# Patient Record
Sex: Female | Born: 1960 | Race: White | Hispanic: No | Marital: Married | Smoking: Current every day smoker
Health system: Southern US, Community
[De-identification: ages and names within clinical notes are randomized; demographics above are authoritative.]

## PROBLEM LIST (undated history)

## (undated) ENCOUNTER — Ambulatory Visit: Payer: Self-pay

---

## 2007-07-04 ENCOUNTER — Ambulatory Visit: Payer: Self-pay

## 2021-11-10 ENCOUNTER — Ambulatory Visit
Admission: EM | Admit: 2021-11-10 | Discharge: 2021-11-10 | Disposition: A | Discharge status: Home / Self Care | Payer: Managed Care, Other (non HMO) | Attending: Emergency Medicine | Admitting: Emergency Medicine

## 2021-11-10 ENCOUNTER — Ambulatory Visit
Admission: RE | Admit: 2021-11-10 | Discharge: 2021-11-10 | Disposition: A | Discharge status: Home / Self Care | Payer: Managed Care, Other (non HMO) | Source: Ambulatory Visit | Attending: Emergency Medicine | Admitting: Emergency Medicine

## 2021-11-10 DIAGNOSIS — M7989 Other specified soft tissue disorders: Secondary | ICD-10-CM | POA: Diagnosis present

## 2021-11-10 NOTE — ED Provider Notes (Signed)
[facility]    CSN: [id number] Arrival date & time: 11/10/21  0815      History   Chief Complaint Chief Complaint  Patient presents with   Leg Swelling    Left    HPI Amanda Avila is a 61 y.o. female.   Patient presents with left lower extremity swelling and pain for 2 days.  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  Calf muscle feels as if it will burst and overnight she had a cramp present.  She is able to bear weight.  Range of motion intact .  Has attempted use of ibuprofen and rest which has been ineffective.  Denies erythema or warmth to the skin, numbness or tingling.  Daily smoker.   16 on left , 14 on the right  History reviewed. No pertinent past medical history.  There are no problems to display for this patient.   History reviewed. No pertinent surgical history.  OB History   No obstetric history on file.      Home Medications    Prior to Admission medications   Not on File    Family History History reviewed. No pertinent family history.  Social History Social History   Tobacco Use   Smoking status: Every Day    Types: Cigarettes   Smokeless tobacco: Never  Vaping Use   Vaping Use: Never used  Substance Use Topics   Alcohol use: Never   Drug use: Yes    Types: Marijuana     Allergies   Patient has no allergy information on record.   Review of Systems Review of Systems   Physical Exam Triage Vital Signs ED Triage Vitals  Enc Vitals Group     BP 11/10/21 0837 (!) 171/99     Pulse Rate 11/10/21 0837 78     Resp 11/10/21 0837 18     Temp 11/10/21 0837 98.3 F (36.8 C)     Temp Source 11/10/21 0837 Oral     SpO2 11/10/21 0837 96 %     Weight 11/10/21 0836 178 lb (80.7 kg)     Height 11/10/21 0836 5\' 6"  (1.676 m)     Head Circumference --      Peak Flow --      Pain Score 11/10/21 0835 2     Pain Loc --      Pain Edu? --      Excl. in GC? --    No data  found.  Updated Vital Signs BP (!) 171/99 (BP Location: Left Arm)   Pulse 78   Temp 98.3 F (36.8 C) (Oral)   Resp 18   Ht 5\' 6"  (1.676 m)   Wt 178 lb (80.7 kg)   SpO2 96%   BMI 28.73 kg/m   Visual Acuity Right Eye Distance:   Left Eye Distance:   Bilateral Distance:    Right Eye Near:   Left Eye Near:    Bilateral Near:     Physical Exam Constitutional:      Appearance: Normal appearance.  HENT:     Head: Normocephalic.  Eyes:     Extraocular Movements: Extraocular movements intact.  Pulmonary:     Effort: Pulmonary effort is normal.  Musculoskeletal:     Right lower leg: No swelling. No edema.     Left lower leg: Swelling and tenderness present.     Comments: Left calf measuring 16 inches, right calf measuring 14 inches,  2+ popliteal and dorsalis pedis pulse, able to bear weight, range of motion intact  Neurological:     Mental Status: She is alert and oriented to person, place, and time. Mental status is at baseline.  Psychiatric:        Mood and Affect: Mood normal.        Behavior: Behavior normal.     UC Treatments / Results  Labs (all labs ordered are listed, but only abnormal results are displayed) Labs Reviewed - No data to display  EKG   Radiology No results found.  Procedures Procedures (including critical care time)  Medications Ordered in UC Medications - No data to display  Initial Impression / Assessment and Plan / UC Course  I have reviewed the triage vital signs and the nursing notes.  Pertinent labs & imaging results that were available during my care of the patient were reviewed by me and considered in my medical decision making (see chart for details).  Left leg swelling  Unknown etiology, DVT versus muscular, discussed with patient, Doppler ultrasound pending, will treat based on results, patient does not have PCP, if DVT negative, will move forward with muscular treatment with follow-up with orthopedics as needed, recommended  RICE and consistent use of NSAIDs for outpatient management  Final Clinical Impressions(s) / UC Diagnoses   Final diagnoses:  None   Discharge Instructions   None    ED Prescriptions   None    PDMP not reviewed this encounter.   Valinda Hoar, [place] 11/10/21 [phone]

## 2021-11-10 NOTE — ED Triage Notes (Signed)
Pt c/o left leg swelling x2days  Pt states that she was working in the garden and felt a sharp pain and now has pain along the calf.  Pt denies any insect bites.  Pt states that she has kept it elevated and had cramps during the night.  Pt states that it is tender to touch and the ankles are swollen.   Pt had a cortisone shot 4 weeks ago in the left knee

## 2021-11-10 NOTE — Discharge Instructions (Signed)
Please go to [facility] at [facility] [address] [place], go into the medical mall entrance and go to the front desk and they will give you further directions  We will complete an ultrasound of your leg to rule out a blood clot  You may return home after you get your ultrasound and you will be called with results and told how to move forward  You may continue use of ibuprofen taking 800 mg 3 times daily for 5 days, this medicine will help to reduce swelling  While in sitting and lying positions elevate extremity to help reduce swelling  Once it is confirmed that you do not have a blood clot you may place heat over the affected area in 15-minute intervals for general comfort  If symptoms continue to persist you may follow-up with your PCP or an orthopedic specialist for further evaluation and management, information is listed below

## 2022-11-22 IMAGING — US US EXTREM LOW VENOUS*L*
1 series · 14 of 24 positions shown · non-contrast
Comparison: None Available.

CLINICAL DATA: LEFT lower extremity swelling

EXAM:
LEFT LOWER EXTREMITY VENOUS DOPPLER ULTRASOUND
TECHNIQUE: Gray-scale sonography with compression, as well as color and duplex
ultrasound, were performed to evaluate the deep venous system(s)
from the level of the common femoral vein through the popliteal and
proximal calf veins.

[Series 1: us venous img lower uni left (dvt) · portal-venous · 14 of 34 slices shown]
[im 1/34]
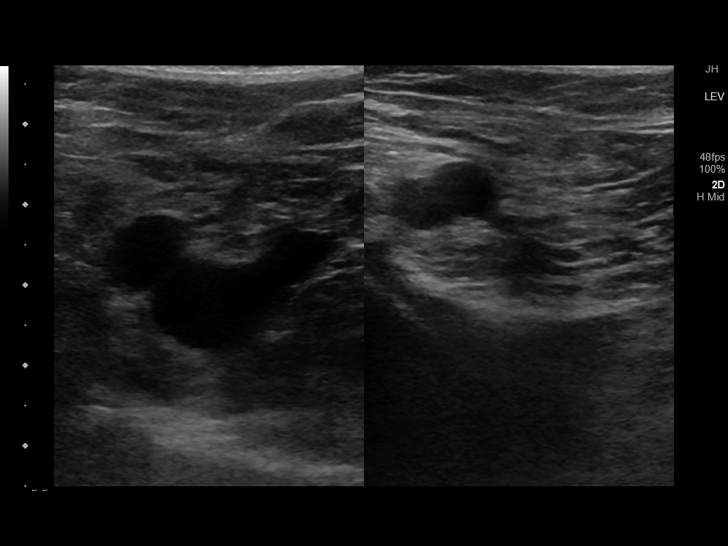
[im 3/34]
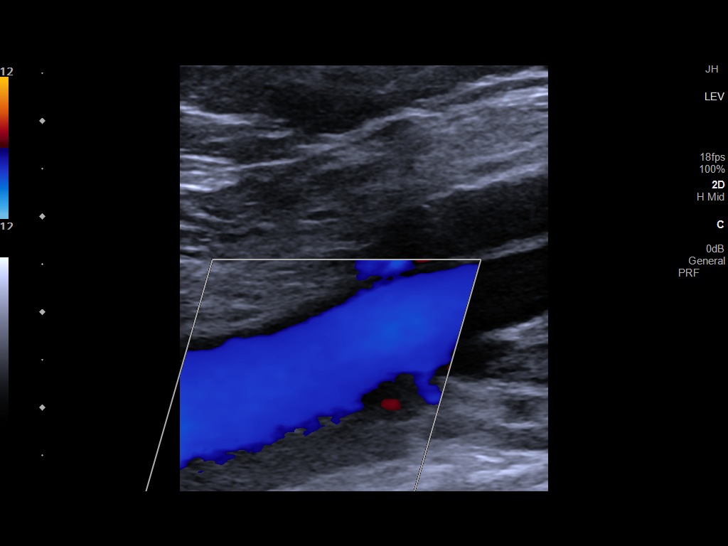
[im 6/34]
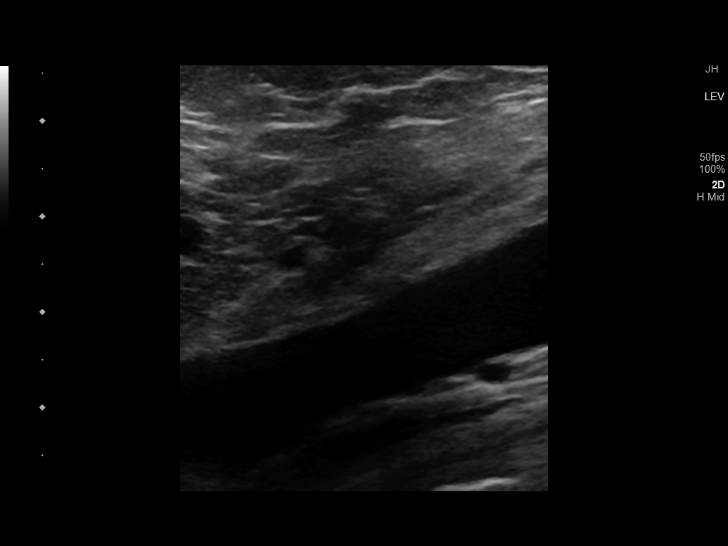
[im 9/34]
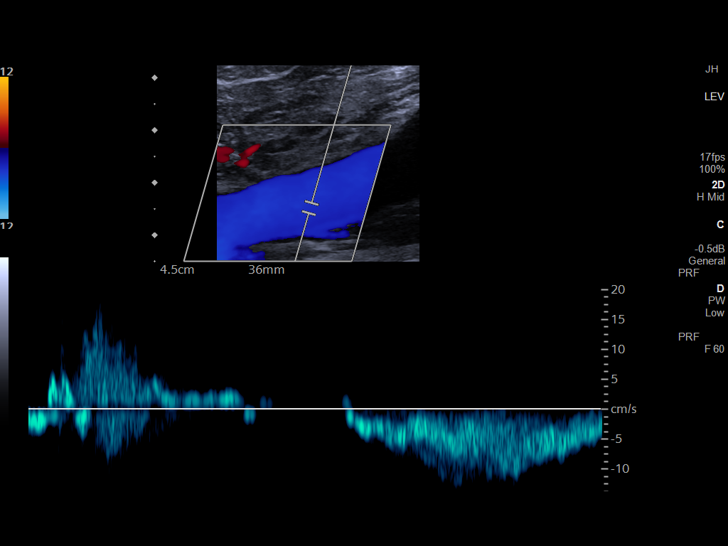
[im 11/34]
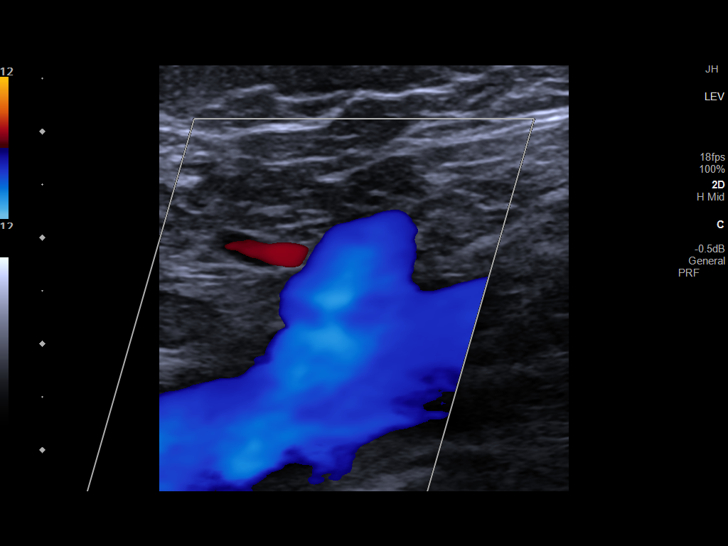
[im 13/34]
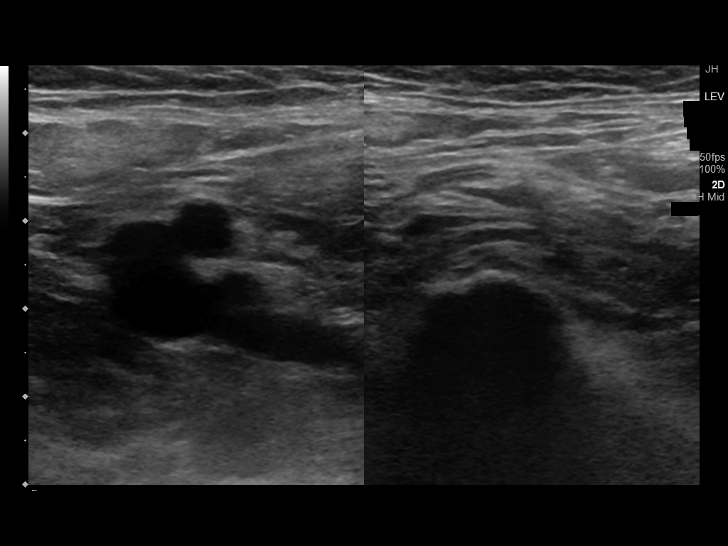
[im 16/34]
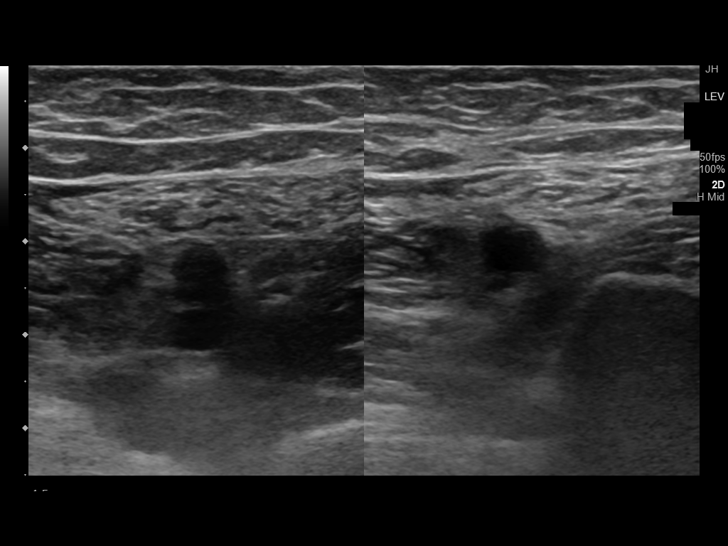
[im 18/34]
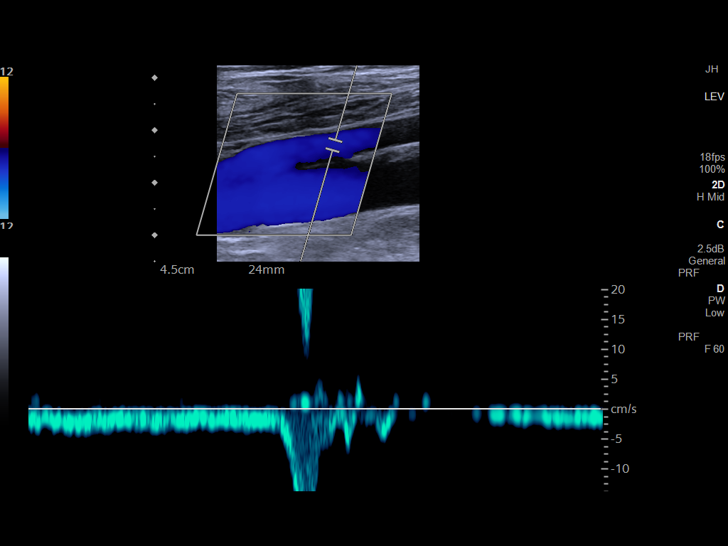
[im 21/34]
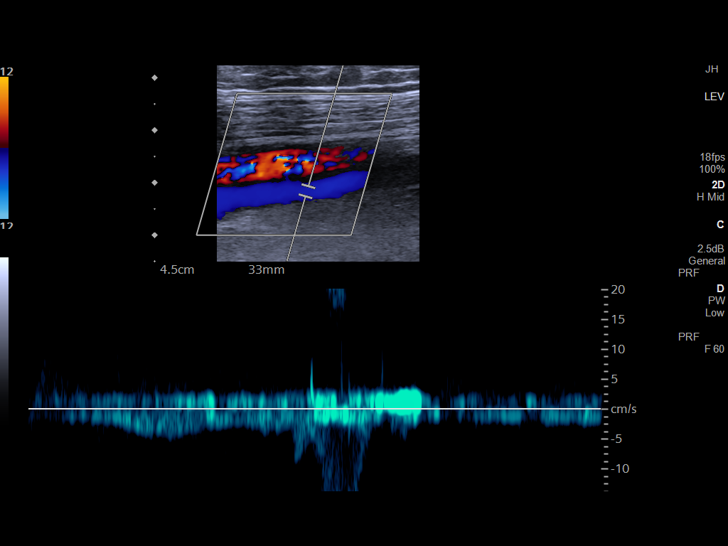
[im 23/34]
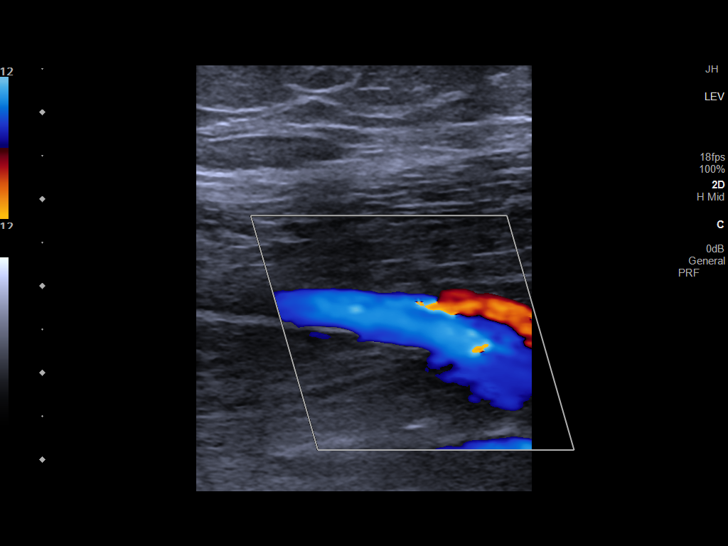
[im 26/34]
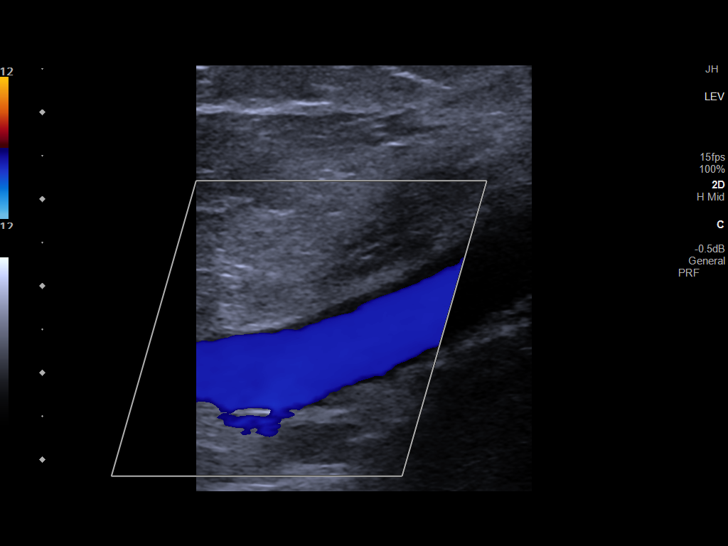
[im 28/34]
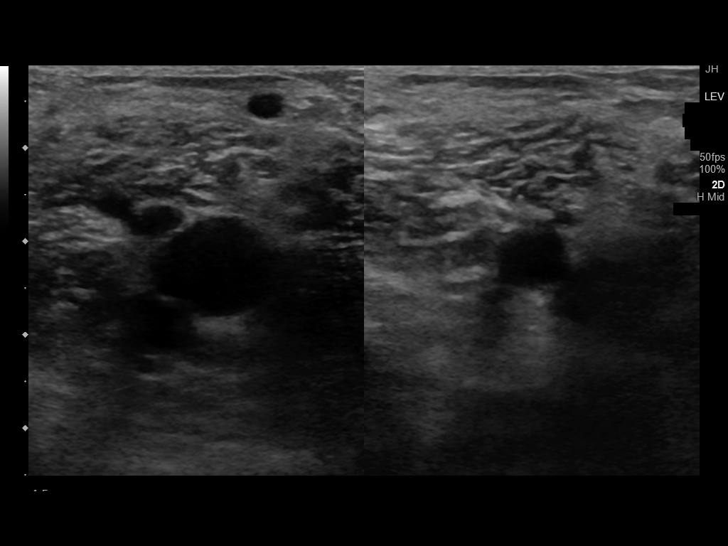
[im 31/34]
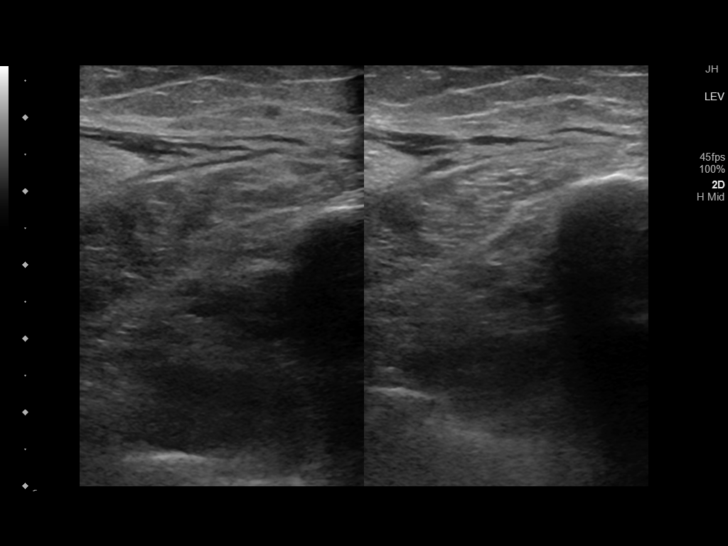
[im 34/34]
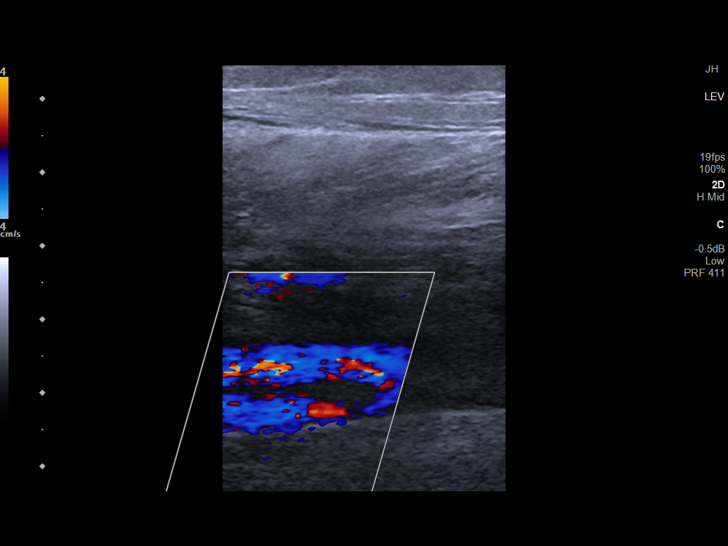

[14 of 24 positions shown; findings below may reference images not displayed]

FINDINGS: VENOUS

Normal compressibility of the LEFT common femoral, superficial
femoral, and popliteal veins, as well as the visualized calf veins.
Visualized portions of profunda femoral vein and great saphenous
vein unremarkable. No filling defects to suggest DVT on grayscale or
color Doppler imaging. Doppler waveforms show normal direction of
venous flow, normal respiratory plasticity and response to
augmentation.

Limited views of the contralateral common femoral vein are
unremarkable.

OTHER

No evidence of superficial thrombophlebitis or abnormal fluid
collection.

Limitations: none
IMPRESSION: No evidence of femoropopliteal DVT within the LEFT lower extremity.

## 2024-04-27 ENCOUNTER — Ambulatory Visit (INDEPENDENT_AMBULATORY_CARE_PROVIDER_SITE_OTHER): Payer: Self-pay

## 2024-04-27 ENCOUNTER — Ambulatory Visit
Admission: EM | Admit: 2024-04-27 | Discharge: 2024-04-27 | Disposition: A | Discharge status: Home / Self Care | Payer: Self-pay | Attending: Family Medicine | Admitting: Family Medicine

## 2024-04-27 DIAGNOSIS — I10 Essential (primary) hypertension: Secondary | ICD-10-CM | POA: Insufficient documentation

## 2024-04-27 DIAGNOSIS — M25532 Pain in left wrist: Secondary | ICD-10-CM

## 2024-04-27 LAB — CBC WITH DIFFERENTIAL/PLATELET
Abs Immature Granulocytes: 0.03 K/uL (ref 0.00–0.07)
Basophils Absolute: 0.1 K/uL (ref 0.0–0.1)
Basophils Relative: 1 %
Eosinophils Absolute: 0.2 K/uL (ref 0.0–0.5)
Eosinophils Relative: 2 %
HCT: 41.8 % (ref 36.0–46.0)
Hemoglobin: 13.2 g/dL (ref 12.0–15.0)
Immature Granulocytes: 0 %
Lymphocytes Relative: 16 %
Lymphs Abs: 1.7 K/uL (ref 0.7–4.0)
MCH: 23.8 pg — ABNORMAL LOW (ref 26.0–34.0)
MCHC: 31.6 g/dL (ref 30.0–36.0)
MCV: 75.3 fL — ABNORMAL LOW (ref 80.0–100.0)
Monocytes Absolute: 0.5 K/uL (ref 0.1–1.0)
Monocytes Relative: 5 %
Neutro Abs: 8.3 K/uL — ABNORMAL HIGH (ref 1.7–7.7)
Neutrophils Relative %: 76 %
Platelets: 306 K/uL (ref 150–400)
RBC: 5.55 MIL/uL — ABNORMAL HIGH (ref 3.87–5.11)
RDW: 15.9 % — ABNORMAL HIGH (ref 11.5–15.5)
WBC: 10.9 K/uL — ABNORMAL HIGH (ref 4.0–10.5)
nRBC: 0 % (ref 0.0–0.2)

## 2024-04-27 LAB — URIC ACID: Uric Acid, Serum: 4.6 mg/dL (ref 2.5–7.1)

## 2024-04-27 MED ORDER — AMLODIPINE BESYLATE 10 MG PO TABS: 10.0000 mg | ORAL_TABLET | Freq: Every day | ORAL | 0 refills | Status: AC

## 2024-04-27 MED ORDER — AMLODIPINE BESYLATE 10 MG PO TABS: 10.0000 mg | ORAL_TABLET | Freq: Every day | ORAL | Status: DC

## 2024-04-27 MED ORDER — PREDNISONE 10 MG (21) PO TBPK: ORAL_TABLET | Freq: Every day | ORAL | 0 refills | Status: AC

## 2024-04-27 NOTE — ED Triage Notes (Signed)
 Pt c/o left wrist swelling, redness, and pain x1day  Pt has taken 800mg  motrin for the pain  Pt states that she can bend her wrist and fingers without difficulty.   Pt denies any falls or injuries.  Pt denies any new physical activities.   Pt has radial side swelling, redness, and warmth on the left wrist.

## 2024-04-27 NOTE — ED Provider Notes (Addendum)
 [facility]    CSN: [id number] Arrival date & time: 04/27/24  9180      History   Chief Complaint Chief Complaint  Patient presents with   Wrist Pain         HPI  HPI Amanda Avila is a 63 y.o. female.   Amanda Avila presents for 2 days of left wrist pain after waking up.  It hurts to touch. Took some Tylenol and Motrin and took a nap. She woke up crying due to her wrist pain. Has swelling and redness.  No known bites. She moved her plants in the house but she wore gloves.  No history of gout.  No falls, traum or injuries.       History reviewed. No pertinent past medical history.  There are no active problems to display for this patient.   History reviewed. No pertinent surgical history.  OB History   No obstetric history on file.      Home Medications    Prior to Admission medications   Medication Sig Start Date End Date Taking? Authorizing Provider  amLODipine (NORVASC) 10 MG tablet Take 1 tablet (10 mg total) by mouth daily. 04/27/24  Yes Nori Winegar, DO  predniSONE (STERAPRED UNI-PAK 21 TAB) 10 MG (21) TBPK tablet Take by mouth daily. Take 6 tabs by mouth daily for 1, then 5 tabs for 1 day, then 4 tabs for 1 day, then 3 tabs for 1 day, then 2 tabs for 1 day, then 1 tab for 1 day. 04/27/24  Yes Donita Newland, DO    Family History History reviewed. No pertinent family history.  Social History Social History   Tobacco Use   Smoking status: Every Day    Types: Cigarettes   Smokeless tobacco: Never  Vaping Use   Vaping status: Never Used  Substance Use Topics   Alcohol use: Never   Drug use: Yes    Types: Marijuana     Allergies   Patient has no known allergies.   Review of Systems Review of Systems: :negative unless otherwise stated in HPI.      Physical Exam Triage Vital Signs ED Triage Vitals  Encounter Vitals Group     BP 04/27/24 0833 (!) 164/114     Girls Systolic BP Percentile --      Girls Diastolic BP Percentile --       Boys Systolic BP Percentile --      Boys Diastolic BP Percentile --      Pulse Rate 04/27/24 0833 88     Resp 04/27/24 0833 (!) 24     Temp 04/27/24 0833 98 F (36.7 C)     Temp Source 04/27/24 0833 Oral     SpO2 04/27/24 0833 94 %     Weight 04/27/24 0831 192 lb 12.8 oz (87.5 kg)     Height --      Head Circumference --      Peak Flow --      Pain Score 04/27/24 0831 4     Pain Loc --      Pain Education --      Exclude from Growth Chart --    No data found.  Updated Vital Signs BP (!) 161/113 (BP Location: Left Arm)   Pulse 88   Temp 98 F (36.7 C) (Oral)   Resp (!) 24   Wt 87.5 kg   SpO2 94%   BMI 31.12 kg/m   Visual Acuity Right Eye Distance:   Left Eye  Distance:   Bilateral Distance:    Right Eye Near:   Left Eye Near:    Bilateral Near:     Physical Exam GEN: well appearing female in no acute distress  CVS: well perfused, regular rate and rhythm RESP: speaking in full sentences without pause, no respiratory distress, clear bilaterally   MSK:  Wrist, Left: Inspection yielded + erythema, no ecchymosis, no bony deformity and mild swelling. ROM full with good flexion and extension and ulnar/radial deviation that is symmetrical with opposite wrist. Palpation is normal over metacarpals, no TTP at scaphoid; TTP over the flexor carpals with the overlying skin changes above, no tendons without tenderness/swelling. Strength 5/5 in all directions without pain. Negative Finkelstein, tinel's and phalens.    UC Treatments / Results  Labs (all labs ordered are listed, but only abnormal results are displayed) Labs Reviewed  CBC WITH DIFFERENTIAL/PLATELET - Abnormal; Notable for the following components:      Result Value   WBC 10.9 (*)    RBC 5.55 (*)    MCV 75.3 (*)    MCH 23.8 (*)    RDW 15.9 (*)    Neutro Abs 8.3 (*)    All other components within normal limits  URIC ACID    EKG   Radiology DG Wrist Complete Left Result Date: 04/27/2024 CLINICAL  DATA:  Left wrist swelling with redness and pain 1 day. No injury. EXAM: LEFT WRIST - COMPLETE 3+ VIEW COMPARISON:  None Available. FINDINGS: Mild ulnar minus variance. Bone mineralization and alignment are normal. Minimal degenerative change over the radial side of the carpal bones and first carpometacarpal joint. No acute fracture or dislocation. Remainder of the exam is unremarkable. IMPRESSION: 1. No acute findings. 2. Minimal degenerative changes as described. Electronically Signed   By: Toribio Agreste M.D.   On: 04/27/2024 09:23     Procedures Procedures (including critical care time)  Medications Ordered in UC Medications - No data to display  Initial Impression / Assessment and Plan / UC Course  I have reviewed the triage vital signs and the nursing notes.  Pertinent labs & imaging results that were available during my care of the patient were reviewed by me and considered in my medical decision making (see chart for details).      Pt is a 63 y.o.  female with 2 days of left wrist pain without known injury.   On exam, pt has tenderness at flexor surface of the wrist with painful ROM.   Obtained left wrist plain films.  Personally interpreted by me were unremarkable for fracture or dislocation. Radiologist report reviewed and additionally notes minimal degenerative changes over the radial side of the carpal bones in the first carpometacarpal joint.  Collected a CBC and uric acid to evaluate for septic arthritis, cellulitis, gout and inflammatory arthritis.    Patient to gradually return to normal activities, as tolerated and continue ordinary activities within the limits permitted by pain. Prescribed prednisone for pain relief.  Tylenol PRN. Advised patient to avoid OTC NSAIDs while taking prescription NSAID. Patient to follow up with orthopedic provider, if symptoms do not improve with conservative treatment.    She has history of hypertension. She is hypertensive here at 164/114.  After  sitting for a while repeat blood pressure 161/113.  She is not compliant with her blood pressure medications.  Does request a refill.  Prescribed amlodipine 10 mg.  Risk of kidney failure, CVA and death discussed if patient does not take her blood pressure  medicine while taking prednisone.  She states that she will take her blood pressure medication.  Return and ED precautions given. Understanding voiced. Discussed MDM, treatment plan and plan for follow-up with patient who agrees with plan.   Final Clinical Impressions(s) / UC Diagnoses   Final diagnoses:  Left wrist pain  Elevated blood pressure reading with diagnosis of hypertension     Discharge Instructions      Your labs results will return in the next return in the next 24 hours. If abnormal, someone will call you.  Start the prednisone today.    Be sure to monitor your blood pressure daily. Take your blood pressure medication daily.  Follow up with your primary care provider for medication changes.      ED Prescriptions     Medication Sig Dispense Auth. Provider   predniSONE (STERAPRED UNI-PAK 21 TAB) 10 MG (21) TBPK tablet Take by mouth daily. Take 6 tabs by mouth daily for 1, then 5 tabs for 1 day, then 4 tabs for 1 day, then 3 tabs for 1 day, then 2 tabs for 1 day, then 1 tab for 1 day. 21 tablet Verdell Dykman, DO   amLODipine (NORVASC) 10 MG tablet Take 1 tablet (10 mg total) by mouth daily. 30 tablet Asaf Elmquist, DO      PDMP not reviewed this encounter.       Kriste Berth, DO 04/27/24 1647

## 2024-04-27 NOTE — Discharge Instructions (Addendum)
 Your labs results will return in the next return in the next 24 hours. If abnormal, someone will call you.  Start the prednisone today.    Be sure to monitor your blood pressure daily. Take your blood pressure medication daily.  Follow up with your primary care provider for medication changes.

## 2024-04-28 ENCOUNTER — Ambulatory Visit (HOSPITAL_COMMUNITY): Payer: Self-pay

## 2024-04-28 MED ORDER — DOXYCYCLINE HYCLATE 100 MG PO TABS
100.0000 mg | ORAL_TABLET | Freq: Two times a day (BID) | ORAL | 0 refills | Status: AC
Start: 2024-04-28 — End: 2024-05-18

## 2024-04-28 NOTE — Telephone Encounter (Signed)
 Patient's results discussed with Dr. Ramage.  Per Dr. Lauro, continue prednisone and start doxycycline. If fever or not improving by day 3 should go to the ER for septic arthritis rule out.   Prescription for doxycycline 100 mg twice daily x 20 days sent to pharmacy on record.
